# Patient Record
Sex: Female | Born: 1995 | Race: White | Hispanic: No | Marital: Single | Smoking: Never smoker
Health system: Southern US, Community
[De-identification: ages and names within clinical notes are randomized; demographics above are authoritative.]

## PROBLEM LIST (undated history)

## (undated) DIAGNOSIS — D499 Neoplasm of unspecified behavior of unspecified site: Secondary | ICD-10-CM

## (undated) DIAGNOSIS — G909 Disorder of the autonomic nervous system, unspecified: Secondary | ICD-10-CM

## (undated) DIAGNOSIS — D352 Benign neoplasm of pituitary gland: Secondary | ICD-10-CM

## (undated) DIAGNOSIS — T782XXA Anaphylactic shock, unspecified, initial encounter: Secondary | ICD-10-CM

## (undated) DIAGNOSIS — J302 Other seasonal allergic rhinitis: Secondary | ICD-10-CM

## (undated) DIAGNOSIS — L309 Dermatitis, unspecified: Secondary | ICD-10-CM

## (undated) DIAGNOSIS — M329 Systemic lupus erythematosus, unspecified: Secondary | ICD-10-CM

## (undated) HISTORY — DX: Neoplasm of unspecified behavior of unspecified site: D49.9

## (undated) HISTORY — PX: ADENOIDECTOMY: SUR15

## (undated) HISTORY — PX: TONSILLECTOMY: SUR1361

## (undated) HISTORY — DX: Systemic lupus erythematosus, unspecified: M32.9

## (undated) HISTORY — PX: OTHER SURGICAL HISTORY: SHX169

---
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
## 2016-03-09 ENCOUNTER — Encounter: Payer: Self-pay | Admitting: *Deleted

## 2016-03-09 ENCOUNTER — Emergency Department
Admission: EM | Admit: 2016-03-09 | Discharge: 2016-03-09 | Disposition: A | Discharge status: Home / Self Care | Attending: Emergency Medicine | Admitting: Emergency Medicine

## 2016-03-09 DIAGNOSIS — R0602 Shortness of breath: Secondary | ICD-10-CM | POA: Diagnosis present

## 2016-03-09 DIAGNOSIS — T783XXA Angioneurotic edema, initial encounter: Secondary | ICD-10-CM | POA: Diagnosis not present

## 2016-03-09 DIAGNOSIS — T7840XA Allergy, unspecified, initial encounter: Secondary | ICD-10-CM

## 2016-03-09 HISTORY — DX: Disorder of the autonomic nervous system, unspecified: G90.9

## 2016-03-09 HISTORY — DX: Other seasonal allergic rhinitis: J30.2

## 2016-03-09 HISTORY — DX: Anaphylactic shock, unspecified, initial encounter: T78.2XXA

## 2016-03-09 HISTORY — DX: Dermatitis, unspecified: L30.9

## 2016-03-09 HISTORY — DX: Benign neoplasm of pituitary gland: D35.2

## 2016-03-09 MED ORDER — EPINEPHRINE 0.3 MG/0.3ML IJ SOAJ
0.3000 mg | Freq: Once | INTRAMUSCULAR | Status: AC
  Administered 2016-03-09: 0.3 mg via INTRAMUSCULAR
  Filled 2016-03-09: qty 0.3

## 2016-03-09 MED ORDER — SODIUM CHLORIDE 0.9 % IV BOLUS (SEPSIS)
1000.0000 mL | Freq: Once | INTRAVENOUS | Status: AC
  Administered 2016-03-09: 1000 mL via INTRAVENOUS

## 2016-03-09 MED ORDER — PREDNISONE 20 MG PO TABS: 20.0000 mg | ORAL_TABLET | Freq: Every day | ORAL | 0 refills | Status: DC

## 2016-03-09 MED ORDER — DIPHENHYDRAMINE HCL 50 MG/ML IJ SOLN
25.0000 mg | Freq: Once | INTRAMUSCULAR | Status: AC
  Administered 2016-03-09: 25 mg via INTRAVENOUS

## 2016-03-09 MED ORDER — FAMOTIDINE IN NACL 20-0.9 MG/50ML-% IV SOLN
20.0000 mg | Freq: Once | INTRAVENOUS | Status: AC
  Administered 2016-03-09: 20 mg via INTRAVENOUS

## 2016-03-09 MED ORDER — METHYLPREDNISOLONE SODIUM SUCC 125 MG IJ SOLR
125.0000 mg | Freq: Once | INTRAMUSCULAR | Status: AC
  Administered 2016-03-09: 125 mg via INTRAVENOUS

## 2016-03-09 NOTE — ED Notes (Signed)

## 2016-03-09 NOTE — ED Provider Notes (Signed)
[facility] Emergency Department Provider Note   ____________________________________________   First MD Initiated Contact with Patient 03/09/16 0033     (approximate)  I have reviewed the triage vital signs and the nursing notes.   HISTORY  Chief Complaint Allergic Reaction    HPI Kelli Harris is a 20 y.o. female comes into the hospital today with some facial swelling and shortness of breath. The patient's father reports that she came home from work around 10:30. He reports that around 11:15 she started feeling as though she was having trouble breathing and her face was swollen. The left side seems to be more swollen than the right. 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 He reports that she did take some Benadryl at home but because she was having some hoarseness of her voice as well as some shortness of breath he decided to come in for evaluation. The patient works at [employer] but does not report any exposures. He reports the Benadryl typically helps him when she has anaphylaxis it comes on quickly. She had some spaghetti for dinner which she's had before and then played with the dog. The patient did not use her EpiPen tonight. Dad was concerned so he decided to bring the patient in for evaluation.   Past Medical History:  Diagnosis Date  . Anaphylaxis   . Autonomic dysfunction   . Eczema   . Pituitary microadenoma ([place])   . Seasonal allergies     There are no active problems to display for this patient.   Past Surgical History:  Procedure Laterality Date  . ADENOIDECTOMY    . Deviated septum repair    . TONSILLECTOMY      Prior to Admission medications   Medication Sig Start Date End Date Taking? Authorizing Provider  predniSONE (DELTASONE) 20 MG tablet Take 1 tablet (20 mg total) by mouth daily. 03/09/16 03/09/17  Loney Hering, MD    Allergies Review of patient's  allergies indicates no known allergies.  No family history on file.  Social History Social History  Substance Use Topics  . Smoking status: Never Smoker  . Smokeless tobacco: Never Used  . Alcohol use No    Review of Systems Constitutional: No fever/chills Eyes: No visual changes. ENT: Hoarse voice. Cardiovascular: Denies chest pain. Respiratory:  shortness of breath. Gastrointestinal: No abdominal pain.  No nausea, no vomiting.  No diarrhea.  No constipation. Genitourinary: Negative for dysuria. Musculoskeletal: Negative for back pain. Skin: Negative for rash. Neurological: Negative for headaches, focal weakness or numbness.  10-point ROS otherwise negative.  ____________________________________________   PHYSICAL EXAM:  VITAL SIGNS: ED Triage Vitals  Enc Vitals Group     BP 03/09/16 0026 112/79     Pulse Rate 03/09/16 0026 78     Resp 03/09/16 0026 18     Temp 03/09/16 0026 97.7 F (36.5 C)     Temp Source 03/09/16 0026 Oral     SpO2 03/09/16 0026 99 %     Weight 03/09/16 0027 117 lb (53.1 kg)     Height 03/09/16 0027 5\' 2"  (1.575 m)     Head Circumference --      Peak Flow --      Pain Score 03/09/16 0027 8     Pain Loc --      Pain Edu? --      Excl. in [place]? --     Constitutional: Alert and oriented.  Well appearing and in no acute distress. Eyes: Conjunctivae are normal. PERRL. EOMI. Head: Atraumatic. Nose: No congestion/rhinnorhea. Mouth/Throat: Mucous membranes are moist.  Oropharynx non-erythematous. Cardiovascular: Normal rate, regular rhythm. Grossly normal heart sounds.  Good peripheral circulation. Respiratory: Normal respiratory effort.  No retractions. Lungs CTAB. Gastrointestinal: Soft and nontender. No distention. Positive bowel sounds Musculoskeletal: No lower extremity tenderness nor edema.  Neurologic:  Normal speech and language.  Skin:  Skin is warm, dry and intact.  Psychiatric: Mood and affect are normal.    ____________________________________________   LABS (all labs ordered are listed, but only abnormal results are displayed)  Labs Reviewed - No data to display ____________________________________________  EKG  none ____________________________________________  RADIOLOGY  none ____________________________________________   PROCEDURES  Procedure(s) performed: None  Procedures  Critical Care performed: No  ____________________________________________   INITIAL IMPRESSION / ASSESSMENT AND PLAN / ED COURSE  Pertinent labs & imaging results that were available during my care of the patient were reviewed by me and considered in my medical decision making (see chart for details).  This is an 20 year old female who comes into the hospital today with some facial swelling and shortness of breath. 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 I will give the patient some Solu-Medrol, Pepcid as well as a dose of Benadryl. Given the patient's hoarse voice and shortness of breath I will also give her dose of epinephrine. I will reassess the patient after she's received the medications. She will also receive a liter of normal saline.  Clinical Course   After approximately 3 hours the patient's facial swelling did have some improvement. Her breathing is easier. Her voice is still quiet but is clear. I will discharge the patient to home. She reports that she has multiple EpiPen's at home and she does have Benadryl as well. I will give the patient some steroids and have her follow back up with her allergist. I discussed this with the patient's father who has no further questions or concerns and the patient be discharged home.  ____________________________________________   FINAL CLINICAL IMPRESSION(S) / ED DIAGNOSES  Final diagnoses:  Allergic reaction, initial encounter  Angioedema, initial encounter      NEW MEDICATIONS STARTED DURING THIS VISIT:  New  Prescriptions   PREDNISONE (DELTASONE) 20 MG TABLET    Take 1 tablet (20 mg total) by mouth daily.     Note:  This document was prepared using Dragon voice recognition software and may include unintentional dictation errors.    Loney Hering, MD 03/09/16 [phone]

## 2016-03-09 NOTE — ED Triage Notes (Addendum)
Pt has swelling to left side of face.  Pt took 1 benadryl at 2315.  Pt reports sore throat and sob.  Sx began at 2315.  No ras noted.  No itching.

## 2016-06-04 ENCOUNTER — Ambulatory Visit
Admission: EM | Admit: 2016-06-04 | Discharge: 2016-06-04 | Disposition: A | Discharge status: Home / Self Care | Attending: Family Medicine | Admitting: Family Medicine

## 2016-06-04 ENCOUNTER — Encounter: Payer: Self-pay | Admitting: Emergency Medicine

## 2016-06-04 DIAGNOSIS — K529 Noninfective gastroenteritis and colitis, unspecified: Secondary | ICD-10-CM | POA: Diagnosis not present

## 2016-06-04 MED ORDER — SODIUM CHLORIDE 0.9 % IV BOLUS (SEPSIS)
1000.0000 mL | Freq: Once | INTRAVENOUS | Status: AC
  Administered 2016-06-04: 1000 mL via INTRAVENOUS

## 2016-06-04 MED ORDER — PROMETHAZINE HCL 25 MG PO TABS: 25.0000 mg | ORAL_TABLET | Freq: Four times a day (QID) | ORAL | 0 refills | Status: DC | PRN

## 2016-06-04 MED ORDER — PROMETHAZINE HCL 25 MG/ML IJ SOLN
25.0000 mg | Freq: Once | INTRAMUSCULAR | Status: AC
  Administered 2016-06-04: 25 mg via INTRAMUSCULAR

## 2016-06-04 NOTE — ED Provider Notes (Signed)
CSN: [id number]     Arrival date & time 06/04/16  1808 History   First MD Initiated Contact with Patient 06/04/16 1913     Chief Complaint  Patient presents with  . Abdominal Pain   (Consider location/radiation/quality/duration/timing/severity/associated sxs/prior Treatment) 20 year old female presents with headache, malaise, abdominal pain and diarrhea that started yesterday evening. Then started dry-heaving late last night- last episode about 2 hours ago. Denies any fever or URI symptoms. Has been unable to keep down fluids for the past 12 hours. No other family members ill with GI symptoms. Mom has been sick with a URI/cough but no GI illness. Takes Zyrtec daily for allergies.    The history is provided by the patient and a parent.    Past Medical History:  Diagnosis Date  . Anaphylaxis   . Autonomic dysfunction   . Eczema   . Pituitary microadenoma ([place])   . Seasonal allergies    Past Surgical History:  Procedure Laterality Date  . ADENOIDECTOMY    . Deviated septum repair    . TONSILLECTOMY     History reviewed. No pertinent family history. Social History  Substance Use Topics  . Smoking status: Never Smoker  . Smokeless tobacco: Never Used  . Alcohol use No   OB History    No data available     Review of Systems  Constitutional: Positive for appetite change, chills and fatigue. Negative for fever.  HENT: Negative for congestion and sore throat.   Respiratory: Negative for cough, chest tightness and shortness of breath.   Cardiovascular: Negative for chest pain.  Gastrointestinal: Positive for abdominal pain, diarrhea, nausea and vomiting. Negative for blood in stool.  Genitourinary: Negative for difficulty urinating, dysuria, hematuria, pelvic pain and vaginal discharge.  Musculoskeletal: Positive for myalgias. Negative for back pain, neck pain and neck stiffness.  Skin: Negative for rash.  Neurological: Positive for dizziness, light-headedness and headaches.  Negative for syncope and numbness.  Hematological: Negative for adenopathy.    Allergies  Peanut-containing drug products  Home Medications   Prior to Admission medications   Medication Sig Start Date End Date Taking? Authorizing Provider  cetirizine (ZYRTEC) 10 MG tablet Take 10 mg by mouth daily.   Yes Historical Provider, MD  norgestimate-ethinyl estradiol (ORTHO-CYCLEN,SPRINTEC,PREVIFEM) 0.25-35 MG-MCG tablet Take 1 tablet by mouth daily.   Yes Historical Provider, MD  promethazine (PHENERGAN) 25 MG tablet Take 1 tablet (25 mg total) by mouth every 6 (six) hours as needed for nausea or vomiting. 06/04/16   Katy Apo, NP   Meds Ordered and Administered this Visit   Medications  sodium chloride 0.9 % bolus 1,000 mL (1,000 mLs Intravenous Given 06/04/16 1943)  promethazine (PHENERGAN) injection 25 mg (25 mg Intramuscular Given 06/04/16 1934)    BP (!) 99/48 (BP Location: Right Arm) Comment: Zoe Lan, NP notified  Pulse (!) 109   Temp 98.7 F (37.1 C) (Oral)   Resp 16   Ht 5\' 2"  (1.575 m)   Wt 117 lb (53.1 kg)   LMP 05/14/2016 (Approximate)   SpO2 98%   BMI 21.40 kg/m  No data found.   Physical Exam  Constitutional: She is oriented to person, place, and time. She appears well-developed and well-nourished. She appears lethargic. She is sleeping. She has a sickly appearance. No distress.  HENT:  Head: Normocephalic and atraumatic.  Right Ear: Hearing, tympanic membrane, external ear and ear canal normal.  Left Ear: Hearing, tympanic membrane, external ear and ear canal normal.  Nose:  Nose normal.  Mouth/Throat: Uvula is midline and oropharynx is clear and moist. Mucous membranes are dry.  Neck: Normal range of motion. Neck supple.  Cardiovascular: Regular rhythm, normal heart sounds and normal pulses.  Tachycardia present.   Pulmonary/Chest: Effort normal and breath sounds normal. No respiratory distress. She has no wheezes.  Abdominal: Soft. Bowel sounds are  normal. There is no hepatosplenomegaly. There is generalized tenderness. There is no rigidity, no rebound, no guarding and no CVA tenderness.  Lymphadenopathy:    She has no cervical adenopathy.  Neurological: She is oriented to person, place, and time. She appears lethargic.  Skin: Skin is warm and dry.  Psychiatric: She has a normal mood and affect.    Urgent Care Course   Clinical Course     Procedures (including critical care time)  Labs Review Labs Reviewed - No data to display  Imaging Review No results found.   Visual Acuity Review  Right Eye Distance:   Left Eye Distance:   Bilateral Distance:    Right Eye Near:   Left Eye Near:    Bilateral Near:         MDM   1. Gastroenteritis    Discussed with mom that she probably has a viral illness. Gave 1 L of fluids today along with Phenergan 25mg  IM. After about 1 hour, patient was feeling better and able to take some sips of water. BP still low but mom indicated that her daughter's diastolic is usually in the 50 to 60's. Recommend continue Phenergan 25mg  1 tablet every 6 hours as needed for nausea. Recommend clear liquids, Gatorade or Powerade tonight and advance diet as tolerated. Note written for work for next 2 days. Patient able to walk out with assistance from Mom. If unable to keep down fluids over the next 12 hours, recommend go to ER for further evaluation. Otherwise follow-up with her primary care provider in 4 to 5 days if diarrhea persists.    Katy Apo, NP 06/04/16 2110

## 2016-06-04 NOTE — ED Triage Notes (Signed)
Patient c/o vomiting, abdominal pain, and diarrhea that started last night.

## 2016-06-04 NOTE — Discharge Instructions (Signed)
Recommend continue Phenergan 25mg  1 tablet every 6 hours as needed for nausea. Slowly increase fluid intake- drink Gatorade or Powerade tonight and then eat a bland diet. Rest. Follow-up with your primary care provider in 3 to 4 days if not resolving or go to ER if unable to keep down fluids over the next 12 hours.

## 2016-06-24 ENCOUNTER — Emergency Department
Admission: EM | Admit: 2016-06-24 | Discharge: 2016-06-25 | Disposition: A | Discharge status: Home / Self Care | Attending: Emergency Medicine | Admitting: Emergency Medicine

## 2016-06-24 ENCOUNTER — Encounter: Payer: Self-pay | Admitting: Emergency Medicine

## 2016-06-24 DIAGNOSIS — T782XXA Anaphylactic shock, unspecified, initial encounter: Secondary | ICD-10-CM | POA: Diagnosis not present

## 2016-06-24 DIAGNOSIS — T7840XA Allergy, unspecified, initial encounter: Secondary | ICD-10-CM | POA: Diagnosis present

## 2016-06-24 DIAGNOSIS — Z91010 Allergy to peanuts: Secondary | ICD-10-CM | POA: Insufficient documentation

## 2016-06-24 MED ORDER — FAMOTIDINE IN NACL 20-0.9 MG/50ML-% IV SOLN
20.0000 mg | Freq: Once | INTRAVENOUS | Status: AC
  Administered 2016-06-24: 20 mg via INTRAVENOUS
  Filled 2016-06-24: qty 50

## 2016-06-24 MED ORDER — EPINEPHRINE 0.3 MG/0.3ML IJ SOAJ: INTRAMUSCULAR | 3 refills | Status: AC

## 2016-06-24 MED ORDER — DIPHENHYDRAMINE HCL 50 MG/ML IJ SOLN
25.0000 mg | Freq: Once | INTRAMUSCULAR | Status: AC
  Administered 2016-06-24: 25 mg via INTRAVENOUS
  Filled 2016-06-24: qty 1

## 2016-06-24 MED ORDER — PREDNISONE 10 MG PO TABS: ORAL_TABLET | ORAL | 0 refills | Status: DC

## 2016-06-24 MED ORDER — METHYLPREDNISOLONE SODIUM SUCC 125 MG IJ SOLR
125.0000 mg | Freq: Once | INTRAMUSCULAR | Status: AC
  Administered 2016-06-24: 125 mg via INTRAVENOUS
  Filled 2016-06-24: qty 2

## 2016-06-24 MED ORDER — EPINEPHRINE 0.3 MG/0.3ML IJ SOAJ: 0.3000 mg | Freq: Once | INTRAMUSCULAR | 2 refills | Status: AC

## 2016-06-24 MED ORDER — SODIUM CHLORIDE 0.9 % IV BOLUS (SEPSIS)
1000.0000 mL | Freq: Once | INTRAVENOUS | Status: AC
  Administered 2016-06-24: 1000 mL via INTRAVENOUS

## 2016-06-24 NOTE — ED Provider Notes (Addendum)
[facility] Emergency Department Provider Note ____________________________________________   I have reviewed the triage vital signs and the triage nursing note.  HISTORY  Chief Complaint Allergic Reaction   Historian Patient's history limited due to severe allergic reaction Mom provides the history  HPI Kelli Harris is a 21 y.o. female with a history of anaphylaxis to nuts, presents with trouble breathing without known ingestion. Mom states she got a phone call around 8:20 PM from the patient who was at work stating that she was having some trouble breathing. Mom went to pick her up and the patient took by mouth Benadryl. At home she was continuing to have trouble breathing and so they brought her over to the emergency department. She had an EpiPen but did not take it.  No known exposure to allergen.  Symptoms are moderate to severe.    Past Medical History:  Diagnosis Date  . Anaphylaxis   . Autonomic dysfunction   . Eczema   . Pituitary microadenoma ([place])   . Seasonal allergies     There are no active problems to display for this patient.   Past Surgical History:  Procedure Laterality Date  . ADENOIDECTOMY    . Deviated septum repair    . TONSILLECTOMY      Prior to Admission medications   Medication Sig Start Date End Date Taking? Authorizing Provider  cetirizine (ZYRTEC) 10 MG tablet Take 10 mg by mouth daily.    Historical Provider, MD  EPINEPHrine (AUVI-Q) 0.3 mg/0.3 mL IJ SOAJ injection Inject into the upper outer thigh as needed for anaphylaxis 06/24/16   Lisa Roca, MD  EPINEPHrine (EPIPEN 2-PAK) 0.3 mg/0.3 mL IJ SOAJ injection Inject 0.3 mLs (0.3 mg total) into the muscle once. 06/24/16 06/24/16  Lisa Roca, MD  norgestimate-ethinyl estradiol (ORTHO-CYCLEN,SPRINTEC,PREVIFEM) 0.25-35 MG-MCG tablet Take 1 tablet by mouth daily.    Historical Provider, MD  predniSONE (DELTASONE) 10 MG tablet 40 mg daily for 4 days 06/24/16    Lisa Roca, MD  promethazine (PHENERGAN) 25 MG tablet Take 1 tablet (25 mg total) by mouth every 6 (six) hours as needed for nausea or vomiting. 06/04/16   Katy Apo, NP    Allergies  Allergen Reactions  . Peanut-Containing Drug Products Anaphylaxis    No family history on file.  Social History Social History  Substance Use Topics  . Smoking status: Never Smoker  . Smokeless tobacco: Never Used  . Alcohol use No    Review of Systems  Constitutional: Negative for Recent illness. Eyes: Negative for visual changes. ENT: Positive for throat swelling. Cardiovascular: Negative for chest pain. Respiratory: Positive for trouble breathing and shortness of breath.. Gastrointestinal: Negative for abdominal pain, vomiting and diarrhea. Genitourinary: Negative for dysuria. Musculoskeletal: Negative for back pain. Skin: Flushed without obvious hives Neurological: Negative for headache. 10 point Review of Systems otherwise negative ____________________________________________   PHYSICAL EXAM:  VITAL SIGNS: ED Triage Vitals  Enc Vitals Group     BP 06/24/16 2107 114/81     Pulse Rate 06/24/16 2107 (!) 126     Resp 06/24/16 2107 (!) 30     Temp --      Temp Source 06/24/16 2107 Oral     SpO2 06/24/16 2107 97 %     Weight 06/24/16 2108 120 lb (54.4 kg)     Height 06/24/16 2108 5\' 2"  (1.575 m)     Head Circumference --      Peak Flow --  Pain Score --      Pain Loc --      Pain Edu? --      Excl. in [place]? --      Constitutional: Alert and Tachypnea and anxious, or respiratory distress. HEENT   Head: Normocephalic and atraumatic.      Eyes: Conjunctivae are normal. PERRL. Normal extraocular movements.      Ears:         Nose: No congestion/rhinnorhea.   Mouth/Throat: Mucous membranes are moist.   Neck: No stridor. Cardiovascular/Chest: Tachycardic, regular rhythm.  No murmurs, rubs, or gallops. Respiratory: Tachypnea without retractions. Moving fair  air, and no wheezing. Gastrointestinal: Soft. No distention, no guarding, no rebound. Nontender.   Genitourinary/rectal:Deferred Musculoskeletal: Nontender with normal range of motion in all extremities. No joint effusions.  No lower extremity tenderness.  No edema. Neurologic:  Normal speech and language. No gross or focal neurologic deficits are appreciated. Skin:  Skin is warm, dry and intact. No rash noted. Psychiatric: Mood and affect are normal. Speech and behavior are normal. Patient exhibits appropriate insight and judgment.   ____________________________________________  LABS (pertinent positives/negatives)  Labs Reviewed - No data to display  ____________________________________________    EKG I, Lisa Roca, MD, the attending physician have personally viewed and interpreted all ECGs.  None ____________________________________________  RADIOLOGY All Xrays were viewed by me. Imaging interpreted by Radiologist.  None __________________________________________  PROCEDURES  Procedure(s) performed: None  Critical Care performed: None  ____________________________________________   ED COURSE / ASSESSMENT AND PLAN  Pertinent labs & imaging results that were available during my care of the patient were reviewed by me and considered in my medical decision making (see chart for details).   Although no ingestion noted, she does have a severe peanut allergy. Nurse told me patient's symptoms and I asked for EpiPen to be given. Nurse gave patient's own EpiPen just before I entered the room and patient's heart rate is 120 to 1:30 and she is anxious but states that her throat and breathing feel better already. She's having some hand cramping.  Blood pressure systolic [id number].  I ordered IV dose of Pepcid, normal saline, Solu-Medrol, and Benadryl.  I discussed with mom and patient and grandfather the importance of early epinephrine for allergic reaction/anaphylaxis in the  future. We discussed there is no exact science behind the amount of time to observe the patient here in the emergency department, and decided to watch her for about 4 hours.  We discussed the potential for biphasic reaction, unknown incidence or risk factors for that, but they have EpiPen and instructed to use it if she has any return of anaphylaxis and we again went over two body systems or any airway issues.   Patient care transferred to Dr. Owens Shark at shift change 11:30 PM. I would anticipate a reevaluation at 1 AM and is reassuring, may be discharged with my prepared discharge instructions and prescriptions. Being prescribed autoinjector refill for EpiPen as well as Auvi-q and short course of prednisone. She is also instructed to take Zantac over-the-counter once daily for 4 days, and Benadryl as needed for any skin redness or itching.    CONSULTATIONS:  None   Patient / Family / Caregiver informed of clinical course, medical decision-making process, and agree with plan.   I discussed return precautions, follow-up instructions, and discharge instructions with patient and/or family.  Addended to include, did not suspect acute cardiac, infectious, or respiratory emergency, symptoms consistent with anaphylactic reaction. ___________________________________________   FINAL CLINICAL  IMPRESSION(S) / ED DIAGNOSES   Final diagnoses:  Anaphylaxis, initial encounter              Note: This dictation was prepared with Dragon dictation. Any transcriptional errors that result from this process are unintentional    Lisa Roca, MD 06/24/16 [place], MD 06/24/16 2329

## 2016-06-24 NOTE — ED Triage Notes (Signed)
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

## 2016-06-24 NOTE — ED Notes (Signed)
Pt admin auto-injector epi pen by nurse

## 2016-06-24 NOTE — ED Notes (Signed)
Administered patient's own epipen.  Patient in respiratory distress with minimal air movement.  MD notified.

## 2016-06-24 NOTE — Discharge Instructions (Signed)
You were treated tonight for anaphylaxis to unknown exposure. As we discussed, please treat anaphylaxis early.  Go ahead and take your epinephrine autoinjector for any trouble breathing or throat discomfort, for any passing out, or for hives plus vomiting/abdominal pain/diarrhea or for any repeated vomiting.

## 2016-06-25 NOTE — ED Notes (Signed)
Pt discharged to home.  Family member driving.  Discharge instructions reviewed.  Verbalized understanding.  No questions or concerns at this time.  Teach back verified.  Pt in NAD.  No items left in ED.   

## 2016-09-03 ENCOUNTER — Ambulatory Visit
Admission: EM | Admit: 2016-09-03 | Discharge: 2016-09-03 | Disposition: A | Discharge status: Home / Self Care | Attending: Family Medicine | Admitting: Family Medicine

## 2016-09-03 ENCOUNTER — Encounter: Payer: Self-pay | Admitting: Certified Nurse Midwife

## 2016-09-03 ENCOUNTER — Ambulatory Visit (INDEPENDENT_AMBULATORY_CARE_PROVIDER_SITE_OTHER): Admitting: Certified Nurse Midwife

## 2016-09-03 ENCOUNTER — Encounter: Payer: Self-pay | Admitting: *Deleted

## 2016-09-03 VITALS — BP 102/66 | HR 88 | Ht 62.0 in | Wt 121.0 lb

## 2016-09-03 DIAGNOSIS — N926 Irregular menstruation, unspecified: Secondary | ICD-10-CM | POA: Diagnosis not present

## 2016-09-03 DIAGNOSIS — Z01419 Encounter for gynecological examination (general) (routine) without abnormal findings: Secondary | ICD-10-CM | POA: Diagnosis not present

## 2016-09-03 DIAGNOSIS — S161XXA Strain of muscle, fascia and tendon at neck level, initial encounter: Secondary | ICD-10-CM | POA: Diagnosis not present

## 2016-09-03 MED ORDER — KETOROLAC TROMETHAMINE 60 MG/2ML IM SOLN
30.0000 mg | Freq: Once | INTRAMUSCULAR | Status: AC
  Administered 2016-09-03: 30 mg via INTRAMUSCULAR

## 2016-09-03 MED ORDER — METAXALONE 800 MG PO TABS: 800.0000 mg | ORAL_TABLET | Freq: Three times a day (TID) | ORAL | 0 refills | Status: DC

## 2016-09-03 MED ORDER — NAPROXEN 500 MG PO TABS: 500.0000 mg | ORAL_TABLET | Freq: Two times a day (BID) | ORAL | 0 refills | Status: DC

## 2016-09-03 NOTE — ED Provider Notes (Signed)
CSN: [id number]     Arrival date & time 09/03/16  1658 History   First MD Initiated Contact with Patient 09/03/16 1720     Chief Complaint  Patient presents with  . Back Pain   (Consider location/radiation/quality/duration/timing/severity/associated sxs/prior Treatment) HPI  21 year old female who is accompanied by her mother is been having right side thoracic back pain since yesterday. She states most of the pain is seems to be over the scapula & trapezial muscle. Denies any radicular symptoms into her upper extremities. Has  pain with deep inspiration mostly yesterday but today is not as bad. Holds her neck. Stiff and is reluctant to move it. He has had a torticollis in the past she found relief with chiropractic.       Past Medical History:  Diagnosis Date  . Anaphylaxis   . Autonomic dysfunction   . Eczema   . Pituitary microadenoma ([place])   . Seasonal allergies    Past Surgical History:  Procedure Laterality Date  . ADENOIDECTOMY    . Deviated septum repair    . TONSILLECTOMY     History reviewed. No pertinent family history. Social History  Substance Use Topics  . Smoking status: Never Smoker  . Smokeless tobacco: Never Used  . Alcohol use No   OB History    Gravida Para Term Preterm AB Living   0 0 0 0 0 0   SAB TAB Ectopic Multiple Live Births   0 0 0 0 0     Review of Systems  Constitutional: Positive for activity change. Negative for chills, fatigue and fever.  Musculoskeletal: Positive for back pain, neck pain and neck stiffness.  All other systems reviewed and are negative.   Allergies  Other; Peanut-containing drug products; and Peanut oil  Home Medications   Prior to Admission medications   Medication Sig Start Date End Date Taking? Authorizing Provider  cetirizine (ZYRTEC) 10 MG tablet Take 10 mg by mouth daily.   Yes Historical Provider, MD  norgestimate-ethinyl estradiol (ORTHO-CYCLEN,SPRINTEC,PREVIFEM) 0.25-35 MG-MCG tablet Take 1 tablet by  mouth daily.   Yes Historical Provider, MD  EPINEPHrine (AUVI-Q) 0.3 mg/0.3 mL IJ SOAJ injection Inject into the upper outer thigh as needed for anaphylaxis 06/24/16   Lisa Roca, MD  metaxalone (SKELAXIN) 800 MG tablet Take 1 tablet (800 mg total) by mouth 3 (three) times daily. 09/03/16   Lorin Picket, PA-C  naproxen (NAPROSYN) 500 MG tablet Take 1 tablet (500 mg total) by mouth 2 (two) times daily with a meal. 09/03/16   Lorin Picket, PA-C   Meds Ordered and Administered this Visit   Medications  ketorolac (TORADOL) injection 30 mg (30 mg Intramuscular Given 09/03/16 1754)    BP 104/66 (BP Location: Left Arm)   Pulse (!) 116   Temp 97.6 F (36.4 C) (Oral)   Resp 16   LMP 09/02/2016 (Exact Date)   SpO2 100%  No data found.   Physical Exam  Constitutional: She is oriented to person, place, and time. She appears well-developed and well-nourished. No distress.  HENT:  Head: Normocephalic and atraumatic.  Eyes: Pupils are equal, round, and reactive to light. Right eye exhibits no discharge. Left eye exhibits no discharge.  Neck:  Examination of the cervical spine shows decreased range of motion to rotation bilaterally particularly to the right. Maximum pain is elicited with lateral flexion and extension on the right. Upper extremity sensation is intact to light touch. Upper extremity strength is intact. DTRs are 2+ over 4  and symmetrical in the upper extremities. There is palpable muscle spasm in the trapezial muscle .  Musculoskeletal:  Refer to neck exam  Neurological: She is alert and oriented to person, place, and time. She displays normal reflexes. No sensory deficit. She exhibits normal muscle tone. Coordination normal.  Skin: Skin is warm and dry. She is not diaphoretic.  Psychiatric: She has a normal mood and affect. Her behavior is normal. Judgment and thought content normal.  Nursing note and vitals reviewed.   Urgent Care Course     Procedures (including critical  care time)  Labs Review Labs Reviewed - No data to display  Imaging Review No results found.   Visual Acuity Review  Right Eye Distance:   Left Eye Distance:   Bilateral Distance:    Right Eye Near:   Left Eye Near:    Bilateral Near:     Medications  ketorolac (TORADOL) injection 30 mg (30 mg Intramuscular Given 09/03/16 1754)      MDM   1. Cervical myofascial strain, initial encounter    Discharge Medication List as of 09/03/2016  5:56 PM    START taking these medications   Details  metaxalone (SKELAXIN) 800 MG tablet Take 1 tablet (800 mg total) by mouth 3 (three) times daily., Starting Fri 09/03/2016, Normal    naproxen (NAPROSYN) 500 MG tablet Take 1 tablet (500 mg total) by mouth 2 (two) times daily with a meal., Starting Fri 09/03/2016, Normal      Plan: 1. Test/x-ray results and diagnosis reviewed with patient 2. rx as per orders; risks, benefits, potential side effects reviewed with patient 3. Recommend supportive treatment with Rest and symptom avoidance. Recommend ice 20 minutes out of every 2 hours 4 times daily. Is cautioned regarding use of Skelaxin with activities requiring concentration are judgment and not to drive while taking it. Follow up with her primary care physician or chiropractor. 4. F/u prn if symptoms worsen or don't improve     Lorin Picket, PA-C 09/03/16 1815

## 2016-09-03 NOTE — ED Triage Notes (Addendum)
Patient started having right side thoracic back pain yesterday. Patient unsure of mechanism of injury. No previous history of back injury.

## 2016-09-09 ENCOUNTER — Encounter: Payer: Self-pay | Admitting: Certified Nurse Midwife

## 2016-09-09 MED ORDER — NORGESTIMATE-ETH ESTRADIOL 0.25-35 MG-MCG PO TABS: 1.0000 | ORAL_TABLET | Freq: Every day | ORAL | 3 refills | Status: DC

## 2016-09-09 NOTE — Progress Notes (Signed)
Gynecology Annual Exam  PCP: [facility]  Chief Complaint:  Chief Complaint  Patient presents with  . Gynecologic Exam    History of Present Illness: Patient is a 21 y.o. G0P0000 presents for annual exam. The patient has no gyn complaints today, but she complains of onset back pain and right shoulder pain yesterday. She thinks she may have pulled a muscle.  Mother will take her to Urgent Care after this appointment. She takes Orthocyclen for cycle control and is not sexually active. Currently a student at [employer], she will be going to [employer] in the fall to major in genetics.  Her menses are q month, last 6 days with medium flow. Has cramping 2-3 days before menses starts. One dose of Naprasyn usually resolves the cramping. Her LMP was 09/02/2016. She has completed her Gardasil series.  Past Medical history is significant for an autonomic Nervous system disorder, a micropituitary tumor, and a severe peanut allergy. Since her last annual visit on 08/07/2015, she has had her wisdom teeth removed. She does not smoke or drink alcohol. She does not take illicit drugs. She does not do self breast exams. She exercises occasionally with HIIT, free weights and elliptical trainer. She does get adequate calcium in her diet.  Review of Systems: Review of Systems  Constitutional: Negative for chills, fever and weight loss.  HENT: Negative for congestion, sinus pain and sore throat.   Eyes: Negative for blurred vision and pain.  Respiratory: Negative for hemoptysis, shortness of breath and wheezing.   Cardiovascular: Negative for chest pain, palpitations and leg swelling.  Gastrointestinal: Negative for abdominal pain, blood in stool, diarrhea, heartburn, nausea and vomiting.  Genitourinary: Negative for dysuria, frequency, hematuria and urgency.  Musculoskeletal: Positive for back pain and myalgias. Negative for joint pain.       And right shoulder pain  Skin: Negative for itching  and rash.  Neurological: Negative for dizziness, tingling and headaches.  Endo/Heme/Allergies: Negative for environmental allergies and polydipsia. Does not bruise/bleed easily.       Negative for hirsutism   Psychiatric/Behavioral: Negative for depression. The patient is not nervous/anxious and does not have insomnia.     Past Medical History:  Past Medical History:  Diagnosis Date  . Anaphylaxis   . Autonomic dysfunction   . Eczema   . Pituitary microadenoma ([place])   . Seasonal allergies     Past Surgical History:  Past Surgical History:  Procedure Laterality Date  . ADENOIDECTOMY    . Deviated septum repair    . TONSILLECTOMY    . WISDOM TOOTH EXTRACTION  2017      Family History:  Family History  Problem Relation Age of Onset  . Osteoarthritis Father   . Diabetes Maternal Grandmother   . Rickets Paternal Grandmother   . Stroke Paternal Grandfather   . Breast cancer Neg Hx   . Ovarian cancer Neg Hx     Social History:  Social History   Social History  . Marital status: Single    Spouse name: N/A  . Number of children: N/A  . Years of education: N/A   Occupational History  . student    Social History Main Topics  . Smoking status: Never Smoker  . Smokeless tobacco: Never Used  . Alcohol use No  . Drug use: No  . Sexual activity: No   Other Topics Concern  . Not on file   Social History Narrative  . No narrative on file  Allergies:  Allergies  Allergen Reactions  . Other Anaphylaxis    ALL nuts -except for almond, pistachios, and walnuts  . Peanut-Containing Drug Products Anaphylaxis  . Peanut Oil Rash    Other reaction(s): ANAPHYLAXIS    Medications: Prior to Admission medications   Medication Sig Start Date End Date Taking? Authorizing Provider  cetirizine (ZYRTEC) 10 MG tablet Take 10 mg by mouth daily.   Yes Historical Provider, MD  EPINEPHrine (AUVI-Q) 0.3 mg/0.3 mL IJ SOAJ injection Inject into the upper outer thigh as needed  for anaphylaxis 06/24/16  Yes Lisa Roca, MD  norgestimate-ethinyl estradiol (ORTHO-CYCLEN,SPRINTEC,PREVIFEM) 0.25-35 MG-MCG tablet Take 1 tablet by mouth daily.   Yes Historical Provider, MD         naproxen (NAPROSYN) 500 MG tablet Take 1 tablet (500 mg total) by mouth 2 (two) times daily with a meal. 09/03/16   Lorin Picket, PA-C  [place] 27 mgm daily, lysine 500 mg daily, multivitamins, vitamin D3 400 IU  Physical Exam Vitals: Blood pressure 102/66, pulse 88, height 5\' 2"  (1.575 m), weight 54.9 kg (121 lb), last menstrual period 09/02/2016.  General: petite, WF, with discomfort moving right arm. Needs assistance removing hoodie. HEENT: normocephalic, anicteric Thyroid: no enlargement, no palpable nodules Pulmonary/ Chest Wall: No increased work of breathing, CTAB. Tenderness medial to the right scapula. Cardiovascular: RRR without murmur Breast: Breast symmetrical, no tenderness, no palpable nodules or masses, no skin or nipple retraction present, no nipple discharge.  No axillary, infraclavicular or  supraclavicular lymphadenopathy. Abdomen:  soft, non-tender, non-distended.  Umbilicus without lesions.  No hepatomegaly,  or masses palpable. No evidence of hernia  Musculoskeletal: decreased ROM of neck turning to right/ holds neck straight Extremities: no edema, erythema, or tenderness Neurologic: Grossly intact Psychiatric: mood appropriate, affect full    Assessment: 21 y.o. G0P0000 with history of irregular menses  Menses regular with birth control pills.  Possible muscle strain of back  Plan:  Will [facility]ill [facility] For a year. To urgent care for treatment of back pain. RTO in 1 year and prn. Reviewed self breast exam.  Kelli Harris, CNM

## 2016-10-30 ENCOUNTER — Other Ambulatory Visit: Payer: Self-pay | Admitting: Advanced Practice Midwife

## 2016-11-01 ENCOUNTER — Telehealth: Payer: Self-pay

## 2016-11-01 MED ORDER — NORGESTIMATE-ETH ESTRADIOL 0.25-35 MG-MCG PO TABS: 1.0000 | ORAL_TABLET | Freq: Every day | ORAL | 3 refills | Status: DC

## 2016-11-01 NOTE — Telephone Encounter (Signed)
Pt seen for annual on 09/03/16. Pt aware [facility].

## 2016-11-01 NOTE — Telephone Encounter (Signed)
CG patient.

## 2016-11-01 NOTE — Telephone Encounter (Signed)
Pt called.  She needs bc refill (which I see has already been done, but) pt is out and need one pack of bc sent to local [facility] in [place].  She is frustrated that she has to go thru this every time she needs a refill.  (pt needs annual sched.)  [phone]

## 2016-11-05 ENCOUNTER — Telehealth: Payer: Self-pay

## 2016-11-05 NOTE — Telephone Encounter (Signed)
Pt calling stating that mononessa was sent in not previfem.  Adv previfem was refilled and they are the same doses.  If wants previfem to let pharm know.

## 2016-11-25 ENCOUNTER — Telehealth: Payer: Self-pay

## 2016-11-25 NOTE — Telephone Encounter (Signed)
Mom, Sadie, calling about previfem rx to [facility].  Apparently there is a mix up/miscommunication between [place] and them - sooner rather than later.  [phone].

## 2016-11-26 NOTE — Telephone Encounter (Signed)
Pt's mother Sadie aware rx verbally [facility] refills.

## 2017-11-23 ENCOUNTER — Ambulatory Visit: Admitting: Obstetrics and Gynecology

## 2017-12-14 ENCOUNTER — Ambulatory Visit: Admitting: Obstetrics and Gynecology

## 2017-12-16 ENCOUNTER — Other Ambulatory Visit (HOSPITAL_COMMUNITY)
Admission: RE | Admit: 2017-12-16 | Discharge: 2017-12-16 | Disposition: A | Discharge status: Home / Self Care | Source: Ambulatory Visit | Attending: Obstetrics and Gynecology | Admitting: Obstetrics and Gynecology

## 2017-12-16 ENCOUNTER — Ambulatory Visit (INDEPENDENT_AMBULATORY_CARE_PROVIDER_SITE_OTHER): Admitting: Advanced Practice Midwife

## 2017-12-16 ENCOUNTER — Encounter: Payer: Self-pay | Admitting: Advanced Practice Midwife

## 2017-12-16 VITALS — BP 100/60 | HR 68 | Ht 63.0 in | Wt 118.0 lb

## 2017-12-16 DIAGNOSIS — N760 Acute vaginitis: Secondary | ICD-10-CM

## 2017-12-16 DIAGNOSIS — F329 Major depressive disorder, single episode, unspecified: Secondary | ICD-10-CM | POA: Insufficient documentation

## 2017-12-16 DIAGNOSIS — N898 Other specified noninflammatory disorders of vagina: Secondary | ICD-10-CM | POA: Diagnosis not present

## 2017-12-16 DIAGNOSIS — F419 Anxiety disorder, unspecified: Secondary | ICD-10-CM | POA: Insufficient documentation

## 2017-12-16 DIAGNOSIS — B9689 Other specified bacterial agents as the cause of diseases classified elsewhere: Secondary | ICD-10-CM

## 2017-12-16 DIAGNOSIS — F32A Depression, unspecified: Secondary | ICD-10-CM | POA: Insufficient documentation

## 2017-12-16 DIAGNOSIS — E237 Disorder of pituitary gland, unspecified: Secondary | ICD-10-CM | POA: Insufficient documentation

## 2017-12-16 MED ORDER — METRONIDAZOLE 500 MG PO TABS: 500.0000 mg | ORAL_TABLET | Freq: Two times a day (BID) | ORAL | 0 refills | Status: DC

## 2017-12-16 NOTE — Progress Notes (Signed)
Patient ID: Kelli Harris, female   DOB: January 10, 1996, 22 y.o.   MRN: [id number]  Reason for Consult: vaginal irritation (Discharge w/odor)    Subjective:     HPI:  Kelli Harris is a 22 y.o. female is in the office today for ongoing concern of vaginal discharge and odor. She has also had some vaginal irritation but does not currently have that. Her symptoms began about a month ago. About 3 weeks ago she was seen at [facility] and was diagnosed and treated for BV. She took the 7 day oral course of Metronidazole. Her symptoms were improved during that time, but since then she states the discharge has persisted along with mild odor.   She is sexually active but denies ever having vaginal penetration intercourse due to perceived pain. She is unable to wear tampons due to pain associated with wearing them. She does not have a concern for STDs.   She used a new body care product about the same time as when her symptoms began.   Her main concern today is the persistent discharge. Discussion of comfort/preventive measures. Discussion of pelvic floor PT if she would like to pursue that at some time.   Past Medical History:  Diagnosis Date  . Anaphylaxis   . Autonomic dysfunction   . Eczema   . Pituitary microadenoma ([place])   . Seasonal allergies    Family History  Problem Relation Age of Onset  . Osteoarthritis Father   . Diabetes Maternal Grandmother   . Rickets Paternal Grandmother   . Stroke Paternal Grandfather   . Breast cancer Neg Hx   . Ovarian cancer Neg Hx    Past Surgical History:  Procedure Laterality Date  . ADENOIDECTOMY    . Deviated septum repair    . TONSILLECTOMY    . WISDOM TOOTH EXTRACTION  2017    Short Social History:  Social History   Tobacco Use  . Smoking status: Never Smoker  . Smokeless tobacco: Never Used  Substance Use Topics  . Alcohol use: No    Allergies  Allergen Reactions  . Other Anaphylaxis    ALL nuts -except for almond,  pistachios, and walnuts  . Peanut-Containing Drug Products Anaphylaxis  . Peanut Oil Rash    Other reaction(s): ANAPHYLAXIS    Current Outpatient Medications  Medication Sig Dispense Refill  . cetirizine (ZYRTEC) 10 MG tablet Take 10 mg by mouth daily.    . naproxen (NAPROSYN) 500 MG tablet Take 1 tablet (500 mg total) by mouth 2 (two) times daily with a meal. 60 tablet 0  . norgestimate-ethinyl estradiol (PREVIFEM) 0.25-35 MG-MCG tablet Take 1 tablet by mouth daily. 84 tablet 3  . Cholecalciferol (VITAMIN D3) 400 units CAPS Take 1 capsule by mouth daily.    Marland Kitchen EPINEPHrine (AUVI-Q) 0.3 mg/0.3 mL IJ SOAJ injection Inject into the upper outer thigh as needed for anaphylaxis (Patient not taking: Reported on 12/16/2017) 1 Device 3  . fluconazole (DIFLUCAN) 150 MG tablet TAKE ONE TABLET BY MOUTH AS A ONE TIME DOSE  0  . Lysine HCl 500 MG TABS Take 1 tablet by mouth daily.    . methylphenidate 27 MG PO CR tablet Take 27 mg by mouth every morning.    . metroNIDAZOLE (FLAGYL) 500 MG tablet Take 1 tablet (500 mg total) by mouth 2 (two) times daily for 7 days. 14 tablet 0  . Multiple Vitamin (MULTIVITAMIN) capsule Take 1 capsule by mouth daily.    . Omega-3 Fatty Acids (  FISH OIL) 1000 MG CAPS Take by mouth.    . vitamin C (ASCORBIC ACID) 500 MG tablet Take by mouth.     No current facility-administered medications for this visit.     Review of Systems  Constitutional:  Constitutional negative. HENT: HENT negative.  Eyes: Eyes negative.  Respiratory: Respiratory negative.  Cardiovascular: Cardiovascular negative.  GI: Gastrointestinal negative.  GU:       Vaginal discharge with odor Musculoskeletal: Musculoskeletal negative.  Skin: Skin negative.  Neurological: Neurological negative. Hematologic: Hematologic/lymphatic negative.  Psychiatric: Psychiatric negative.        Objective:  Objective   Vitals:   12/16/17 1034  BP: 100/60  Pulse: 68  Weight: 118 lb (53.5 kg)  Height: 5\' 3"   (1.6 m)   Body mass index is 20.9 kg/m.  Vital Signs: BP 100/60 (BP Location: Left Arm, Patient Position: Sitting, Cuff Size: Normal)   Pulse 68   Ht 5\' 3"  (1.6 m)   Wt 118 lb (53.5 kg)   LMP 11/23/2017   BMI 20.90 kg/m  Constitutional: Well nourished, well developed female in no acute distress.  HEENT: normal Skin: Warm and dry.  Respiratory:  Normal respiratory effort Psych: Alert and Oriented x3. No memory deficits. Normal mood and affect.   Pelvic exam:  is not limited by body habitus EGBUS: within normal limits, no external evidence of discharge  Vagina: swab only for specimen collection, per patient preference no speculum Cervix: not evaluated  Wet Prep: positive for a few clue cells, negative for whiff or yeast       Assessment/Plan:     22 yo G0P0 female with probable bacterial vaginosis  NuSwab vaginitis to lab Metronidazole 500 mg PO BID 7 days    Rod Can CNM

## 2017-12-16 NOTE — Patient Instructions (Signed)

## 2017-12-19 LAB — CERVICOVAGINAL ANCILLARY ONLY
BACTERIAL VAGINITIS: NEGATIVE
CANDIDA VAGINITIS: POSITIVE — AB
TRICH (WINDOWPATH): NEGATIVE

## 2017-12-22 ENCOUNTER — Other Ambulatory Visit: Payer: Self-pay | Admitting: Advanced Practice Midwife

## 2017-12-22 DIAGNOSIS — B373 Candidiasis of vulva and vagina: Secondary | ICD-10-CM

## 2017-12-22 DIAGNOSIS — B3731 Acute candidiasis of vulva and vagina: Secondary | ICD-10-CM

## 2017-12-22 MED ORDER — FLUCONAZOLE 150 MG PO TABS: 150.0000 mg | ORAL_TABLET | Freq: Once | ORAL | 1 refills | Status: AC

## 2017-12-22 NOTE — Progress Notes (Signed)
Spoke with patient and advised discontinue flagyl since lab came back negative for BV and positive for yeast. Rx diflucan sent to patient pharmacy.

## 2023-02-11 ENCOUNTER — Ambulatory Visit
Admission: EM | Admit: 2023-02-11 | Discharge: 2023-02-11 | Disposition: A | Discharge status: Home / Self Care | Attending: Internal Medicine | Admitting: Internal Medicine

## 2023-02-11 DIAGNOSIS — I889 Nonspecific lymphadenitis, unspecified: Secondary | ICD-10-CM | POA: Diagnosis not present

## 2023-02-11 MED ORDER — FLUCONAZOLE 150 MG PO TABS: 150.0000 mg | ORAL_TABLET | Freq: Every day | ORAL | 0 refills | Status: DC

## 2023-02-11 MED ORDER — CEPHALEXIN 500 MG PO CAPS: 500.0000 mg | ORAL_CAPSULE | Freq: Four times a day (QID) | ORAL | 0 refills | Status: DC

## 2023-02-11 NOTE — ED Triage Notes (Addendum)
Patient present with "swollen bump" in vaginal area x 4 weeks. Treated with ibuprofen 800 mg BID.

## 2023-02-11 NOTE — ED Provider Notes (Addendum)
[facility]    CSN: [id number] Arrival date & time: 02/11/23  1649      History   Chief Complaint No chief complaint on file.   HPI Kelli Harris is a 27 y.o. female who presents with swollen R groin node x 4 weeks. This started a couple of weeks after she had IUD placed and saw her GYN after it was removed and had a pelvic US which was normal. But they never palpated the groin lump she has. This area is tender but not getting any larger in the past month. She has had negative STD test in the past month and has not had sex with her boyfriend in 2 months. The tests were done after that sex encounter. Has not had fever, wt loss, sweats, fatigue or abnormal vaginal discharge.     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)    Eczema    Lupus (HCC)    Pituitary microadenoma (HCC)    Seasonal allergies     Patient Active Problem List   Diagnosis Date Noted   Pituitary lesion (HCC) 12/16/2017   Anxiety 12/16/2017   Depression 12/16/2017    Past Surgical History:  Procedure Laterality Date   ADENOIDECTOMY     Deviated septum repair     TONSILLECTOMY     WISDOM TOOTH EXTRACTION  2017    OB History     Gravida  0   Para  0   Term  0   Preterm  0   AB  0   Living  0      SAB  0   IAB  0   Ectopic  0   Multiple  0   Live Births  0            Home Medications    Prior to Admission medications   Medication Sig Start Date End Date Taking? Authorizing Provider  cephALEXin (KEFLEX) 500 MG capsule Take 1 capsule (500 mg total) by mouth 4 (four) times daily. 02/11/23  Yes Rodriguez-Southworth, Nettie Elm, PA-C  cetirizine (ZYRTEC) 10 MG tablet Take 10 mg by mouth daily.    [provider]  Cholecalciferol (VITAMIN D3) 400 units CAPS Take 1 capsule by mouth daily.    [provider]  EPINEPHrine (AUVI-Q) 0.3  mg/0.3 mL IJ SOAJ injection Inject into the upper outer thigh as needed for anaphylaxis Patient not taking: Reported on 12/16/2017 06/24/16   Governor Rooks, MD  Lysine HCl 500 MG TABS Take 1 tablet by mouth daily.    [provider]  methylphenidate 27 MG PO CR tablet Take 27 mg by mouth every morning.    [provider]  Multiple Vitamin (MULTIVITAMIN) capsule Take 1 capsule by mouth daily.    [provider]  naproxen (NAPROSYN) 500 MG tablet Take 1 tablet (500 mg total) by mouth 2 (two) times daily with a meal. 09/03/16   Lutricia Feil, PA-C  norgestimate-ethinyl estradiol (PREVIFEM) 0.25-35 MG-MCG tablet Take 1 tablet by mouth daily. 11/01/16   Farrel Conners, CNM  Omega-3 Fatty Acids (FISH OIL) 1000 MG CAPS Take by mouth.    [provider]  vitamin C (ASCORBIC ACID) 500 MG tablet Take by mouth.    [provider]    Family History Family History  Problem Relation Age of Onset   Osteoarthritis Father    Diabetes  Maternal Grandmother    Rickets Paternal Grandmother    Stroke Paternal Grandfather    Breast cancer Neg Hx    Ovarian cancer Neg Hx     Social History Social History   Tobacco Use   Smoking status: Never   Smokeless tobacco: Never  Substance Use Topics   Alcohol use: No   Drug use: No     Allergies   Other, Peanut-containing drug products, Gabapentin, and Peanut oil   Review of Systems Review of Systems  As noted in HPI Physical Exam Triage Vital Signs ED Triage Vitals  Encounter Vitals Group     BP 02/11/23 1704 95/64     Systolic BP Percentile --      Diastolic BP Percentile --      Pulse Rate 02/11/23 1704 98     Resp 02/11/23 1704 17     Temp 02/11/23 1704 98.2 F (36.8 C)     Temp Source 02/11/23 1704 Oral     SpO2 02/11/23 1704 100 %     Weight 02/11/23 1700 106 lb 6.4 oz (48.3 kg)     Height 02/11/23 1700 5\' 2"  (1.575 m)     Head Circumference --      Peak Flow --      Pain Score 02/11/23  1659 6     Pain Loc --      Pain Education --      Exclude from Growth Chart --    No data found.  Updated Vital Signs BP 95/64 (BP Location: Left Arm)   Pulse 98   Temp 98.2 F (36.8 C) (Oral)   Resp 17   Ht 5\' 2"  (1.575 m)   Wt 106 lb 6.4 oz (48.3 kg)   LMP 12/29/2022   SpO2 100%   BMI 19.46 kg/m   Visual Acuity Right Eye Distance:   Left Eye Distance:   Bilateral Distance:    Right Eye Near:   Left Eye Near:    Bilateral Near:     Physical Exam Vitals and nursing note reviewed.  Constitutional:      General: She is not in acute distress.    Appearance: She is not toxic-appearing.     Comments: slim  HENT:     Right Ear: External ear normal.     Left Ear: External ear normal.  Eyes:     General: No scleral icterus.    Conjunctiva/sclera: Conjunctivae normal.  Pulmonary:     Effort: Pulmonary effort is normal.  Musculoskeletal:        General: Normal range of motion.     Cervical back: Neck supple.  Lymphadenopathy:     Lower Body: Right inguinal adenopathy present. No left inguinal adenopathy.     Comments: Is about 1.5 cm x 1.5 cm soft and mobile but tender. No redness of the skin noted.   Skin:    General: Skin is warm and dry.  Neurological:     Mental Status: She is alert and oriented to person, place, and time.     Gait: Gait normal.  Psychiatric:        Mood and Affect: Mood normal.        Behavior: Behavior normal.        Thought Content: Thought content normal.        Judgment: Judgment normal.      UC Treatments / Results  Labs (all labs ordered are listed, but only abnormal results are displayed) Labs Reviewed - No  data to display  EKG   Radiology No results found.  Procedures Procedures (including critical care time)  Medications Ordered in UC Medications - No data to display  Initial Impression / Assessment and Plan / UC Course  I have reviewed the triage vital signs and the nursing notes.  R inguinal lymphadenitis  I  placed her on Keflex and Diflucan as noted Advised to call her PCP for FU in 2 weeks in case this nodes does not resolve.     Final Clinical Impressions(s) / UC Diagnoses   Final diagnoses:  Lymphadenitis   Discharge Instructions   None    ED Prescriptions     Medication Sig Dispense Auth. Provider   cephALEXin (KEFLEX) 500 MG capsule Take 1 capsule (500 mg total) by mouth 4 (four) times daily. 40 capsule Rodriguez-Southworth, Nettie Elm, PA-C      PDMP not reviewed this encounter.   Garey Ham, PA-C 02/11/23 1830    Rodriguez-Southworth, Nettie Elm, PA-C 02/11/23 1831    Garey Ham, PA-C 02/11/23 1911

## 2023-04-09 ENCOUNTER — Encounter: Payer: Self-pay | Admitting: Emergency Medicine

## 2023-04-09 ENCOUNTER — Ambulatory Visit
Admission: EM | Admit: 2023-04-09 | Discharge: 2023-04-09 | Disposition: A | Discharge status: Home / Self Care | Payer: Managed Care, Other (non HMO) | Attending: Family Medicine | Admitting: Family Medicine

## 2023-04-09 DIAGNOSIS — B9689 Other specified bacterial agents as the cause of diseases classified elsewhere: Secondary | ICD-10-CM | POA: Diagnosis present

## 2023-04-09 DIAGNOSIS — B3731 Acute candidiasis of vulva and vagina: Secondary | ICD-10-CM | POA: Insufficient documentation

## 2023-04-09 DIAGNOSIS — N760 Acute vaginitis: Secondary | ICD-10-CM | POA: Insufficient documentation

## 2023-04-09 LAB — WET PREP, GENITAL
Sperm: NONE SEEN
Trich, Wet Prep: NONE SEEN
WBC, Wet Prep HPF POC: >=10 — AB (ref <10)

## 2023-04-09 MED ORDER — METRONIDAZOLE 500 MG PO TABS
500.0000 mg | ORAL_TABLET | Freq: Two times a day (BID) | ORAL | 0 refills | Status: AC
Start: 2023-04-09 — End: 2023-04-16

## 2023-04-09 MED ORDER — FLUCONAZOLE 150 MG PO TABS: 150.0000 mg | ORAL_TABLET | ORAL | 0 refills | Status: AC

## 2023-04-09 NOTE — ED Provider Notes (Signed)
[facility]    CSN: [id number] Arrival date & time: 04/09/23  0930      History   Chief Complaint Chief Complaint  Patient presents with   Vaginal Discharge    HPI Kelli Harris is a 27 y.o. female.    Vaginal Discharge Here for vaginal itching, discharge, and odor.  No abdominal pain or vomiting or nausea.  No fever  She is not concerned about possibly having STIs.  She is allergic to gabapentin.  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)    Eczema    Lupus    Pituitary microadenoma (HCC)    Seasonal allergies     Patient Active Problem List   Diagnosis Date Noted   Pituitary lesion (HCC) 12/16/2017   Anxiety 12/16/2017   Depression 12/16/2017    Past Surgical History:  Procedure Laterality Date   ADENOIDECTOMY     Deviated septum repair     TONSILLECTOMY     WISDOM TOOTH EXTRACTION  2017    OB History     Gravida  0   Para  0   Term  0   Preterm  0   AB  0   Living  0      SAB  0   IAB  0   Ectopic  0   Multiple  0   Live Births  0            Home Medications    Prior to Admission medications   Medication Sig Start Date End Date Taking? Authorizing Provider  cetirizine (ZYRTEC) 10 MG tablet Take 10 mg by mouth daily.   Yes [provider]  Cholecalciferol (VITAMIN D3) 400 units CAPS Take 1 capsule by mouth daily.   Yes [provider]  fluconazole (DIFLUCAN) 150 MG tablet Take 1 tablet (150 mg total) by mouth every 3 (three) days for 3 doses. 04/09/23 04/16/23 Yes Amalie Koran, Janace Aris, MD  metroNIDAZOLE (FLAGYL) 500 MG tablet Take 1 tablet (500 mg total) by mouth 2 (two) times daily for 7 days. 04/09/23 04/16/23 Yes Zenia Resides, MD  Multiple Vitamin (MULTIVITAMIN) capsule Take 1 capsule by mouth daily.   Yes [provider]   vitamin C (ASCORBIC ACID) 500 MG tablet Take by mouth.   Yes [provider]  EPINEPHrine (AUVI-Q) 0.3 mg/0.3 mL IJ SOAJ injection Inject into the upper outer thigh as needed for anaphylaxis Patient not taking: Reported on 12/16/2017 06/24/16   Governor Rooks, MD    Family History Family History  Problem Relation Age of Onset   Osteoarthritis Father    Diabetes Maternal Grandmother    Rickets Paternal Grandmother    Stroke Paternal Grandfather    Breast cancer Neg Hx    Ovarian cancer Neg Hx     Social History Social History   Tobacco Use   Smoking status: Never   Smokeless tobacco: Never  Vaping Use   Vaping status: Never Used  Substance Use Topics   Alcohol use: No   Drug use: No     Allergies   Other, Peanut-containing drug products, Gabapentin, and Peanut oil   Review of Systems Review of Systems  Genitourinary:  Positive for vaginal discharge.     Physical Exam Triage Vital Signs ED Triage Vitals  Encounter Vitals Group     BP  04/09/23 0949 95/65     Systolic BP Percentile --      Diastolic BP Percentile --      Pulse Rate 04/09/23 0949 77     Resp 04/09/23 0949 14     Temp 04/09/23 0949 98.3 F (36.8 C)     Temp Source 04/09/23 0949 Oral     SpO2 04/09/23 0949 99 %     Weight 04/09/23 0946 112 lb (50.8 kg)     Height 04/09/23 0946 5\' 2"  (1.575 m)     Head Circumference --      Peak Flow --      Pain Score 04/09/23 0946 7     Pain Loc --      Pain Education --      Exclude from Growth Chart --    No data found.  Updated Vital Signs BP 95/65 (BP Location: Left Arm)   Pulse 77   Temp 98.3 F (36.8 C) (Oral)   Resp 14   Ht 5\' 2"  (1.575 m)   Wt 50.8 kg   LMP 03/20/2023 (Exact Date)   SpO2 99%   BMI 20.49 kg/m   Visual Acuity Right Eye Distance:   Left Eye Distance:   Bilateral Distance:    Right Eye Near:   Left Eye Near:    Bilateral Near:     Physical Exam Vitals reviewed.  Constitutional:      General: She is not in  acute distress.    Appearance: She is not ill-appearing, toxic-appearing or diaphoretic.  Skin:    Coloration: Skin is not pale.  Neurological:     Mental Status: She is alert and oriented to person, place, and time.  Psychiatric:        Behavior: Behavior normal.      UC Treatments / Results  Labs (all labs ordered are listed, but only abnormal results are displayed) Labs Reviewed  WET PREP, GENITAL - Abnormal; Notable for the following components:      Result Value   Yeast Wet Prep HPF POC PRESENT (*)    Clue Cells Wet Prep HPF POC PRESENT (*)    WBC, Wet Prep HPF POC >=10 (*)    All other components within normal limits    EKG   Radiology No results found.  Procedures Procedures (including critical care time)  Medications Ordered in UC Medications - No data to display  Initial Impression / Assessment and Plan / UC Course  I have reviewed the triage vital signs and the nursing notes.  Pertinent labs & imaging results that were available during my care of the patient were reviewed by me and considered in my medical decision making (see chart for details).     Wet prep shows yeast and clue cells.  Metronidazole is sent in to treat probable BV and Diflucan is sent in with 3 doses to treat candidiasis. Final Clinical Impressions(s) / UC Diagnoses   Final diagnoses:  Yeast vaginitis  BV (bacterial vaginosis)     Discharge Instructions      Wet prep test showed yeast and signs of BV.  Take fluconazole 150 mg--1 tablet every 3 days for 3 doses  Take metronidazole 500 mg--1 tablet 2 times daily for 7 days.  Avoid drinking alcohol within 72 hours of taking this medication      ED Prescriptions     Medication Sig Dispense Auth. Provider   metroNIDAZOLE (FLAGYL) 500 MG tablet Take 1 tablet (500 mg total) by mouth 2 (  two) times daily for 7 days. 14 tablet Skyler Carel, Janace Aris, MD   fluconazole (DIFLUCAN) 150 MG tablet Take 1 tablet (150 mg total) by mouth  every 3 (three) days for 3 doses. 3 tablet Trinty Marken, Janace Aris, MD      PDMP not reviewed this encounter.   Zenia Resides, MD 04/09/23 1016

## 2023-04-09 NOTE — Discharge Instructions (Signed)
Wet prep test showed yeast and signs of BV.  Take fluconazole 150 mg--1 tablet every 3 days for 3 doses  Take metronidazole 500 mg--1 tablet 2 times daily for 7 days.  Avoid drinking alcohol within 72 hours of taking this medication

## 2023-04-09 NOTE — ED Triage Notes (Signed)
Patient c/o vaginal discharge and vaginal itching that started 2 days ago.  Patient not concerned about STDs.

## 2023-07-31 ENCOUNTER — Encounter: Payer: Self-pay | Admitting: Emergency Medicine

## 2023-07-31 ENCOUNTER — Ambulatory Visit
Admission: EM | Admit: 2023-07-31 | Discharge: 2023-07-31 | Disposition: A | Discharge status: Home / Self Care | Payer: Managed Care, Other (non HMO) | Attending: Emergency Medicine | Admitting: Emergency Medicine

## 2023-07-31 DIAGNOSIS — Z3202 Encounter for pregnancy test, result negative: Secondary | ICD-10-CM | POA: Insufficient documentation

## 2023-07-31 DIAGNOSIS — H66002 Acute suppurative otitis media without spontaneous rupture of ear drum, left ear: Secondary | ICD-10-CM | POA: Insufficient documentation

## 2023-07-31 DIAGNOSIS — J019 Acute sinusitis, unspecified: Secondary | ICD-10-CM | POA: Diagnosis present

## 2023-07-31 LAB — PREGNANCY, URINE: Preg Test, Ur: NEGATIVE

## 2023-07-31 MED ORDER — PROMETHAZINE-DM 6.25-15 MG/5ML PO SYRP: 5.0000 mL | ORAL_SOLUTION | Freq: Four times a day (QID) | ORAL | 0 refills | Status: AC | PRN

## 2023-07-31 MED ORDER — FLUCONAZOLE 150 MG PO TABS: 150.0000 mg | ORAL_TABLET | Freq: Once | ORAL | 1 refills | Status: AC

## 2023-07-31 MED ORDER — AMOXICILLIN-POT CLAVULANATE 875-125 MG PO TABS: 1.0000 | ORAL_TABLET | Freq: Two times a day (BID) | ORAL | 0 refills | Status: AC

## 2023-07-31 MED ORDER — FLUTICASONE PROPIONATE 50 MCG/ACT NA SUSP: 2.0000 | Freq: Every day | NASAL | 0 refills | Status: AC

## 2023-07-31 NOTE — ED Triage Notes (Signed)
Pt c/o cough, nasal congestion, left ear pain and ringing. Started about a week ago. She states she had a fever in the beginning but none since.

## 2023-07-31 NOTE — ED Provider Notes (Signed)
HPI  SUBJECTIVE:  Kelli Harris is a 28 y.o. female who presents with 1 week of sinus pain and pressure, fevers Tmax 104 at the beginning of the illness.  She has been afebrile for the past 5 days.  She reports nasal congestion, rhinorrhea, which has also resolved.  She has 5 days of left ear pain, tinnitus, decreased hearing, cough productive of yellowish sputum, raw throat and postnasal drip.  No facial swelling, upper dental pain.  No vertigo, otorrhea.  Some wheezing, but this is resolved.  No shortness of breath.  She is unable to sleep at night because of the cough.  No antipyretic in the past 6 hours.  No antibiotics in the past 3 months.  She has been alternating ibuprofen and Tylenol, Mucinex and cetirizine without improvement in her symptoms.  Symptoms are worse with drinking cold liquids.  She has a past medical history of lupus with no renal involvement and a stable pituitary microadenoma.  LMP: 4 weeks ago.  Unsure if she could be pregnant.  She is irregular.  PCP: [facility].   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)    Eczema    Lupus    Pituitary microadenoma (HCC)    Seasonal allergies     Past Surgical History:  Procedure Laterality Date   ADENOIDECTOMY     Deviated septum repair     TONSILLECTOMY     WISDOM TOOTH EXTRACTION  2017    Family History  Problem Relation Age of Onset   Osteoarthritis Father    Diabetes Maternal Grandmother    Rickets Paternal Grandmother    Stroke Paternal Grandfather    Breast cancer Neg Hx    Ovarian cancer Neg Hx     Social History   Tobacco Use   Smoking status: Never   Smokeless tobacco: Never  Vaping Use   Vaping status: Never Used  Substance Use Topics   Alcohol use: No   Drug use: No    No current facility-administered medications for this encounter.  Current Outpatient Medications:     amoxicillin-clavulanate (AUGMENTIN) 875-125 MG tablet, Take 1 tablet by mouth every 12 (twelve) hours for 7 days., Disp: 14 tablet, Rfl: 0   cetirizine (ZYRTEC) 10 MG tablet, Take 10 mg by mouth daily., Disp: , Rfl:    fluticasone (FLONASE) 50 MCG/ACT nasal spray, Place 2 sprays into both nostrils daily., Disp: 16 g, Rfl: 0   Multiple Vitamin (MULTIVITAMIN) capsule, Take 1 capsule by mouth daily., Disp: , Rfl:    promethazine-dextromethorphan (PROMETHAZINE-DM) 6.25-15 MG/5ML syrup, Take 5 mLs by mouth 4 (four) times daily as needed for cough., Disp: 118 mL, Rfl: 0   vitamin C (ASCORBIC ACID) 500 MG tablet, Take by mouth., Disp: , Rfl:    Cholecalciferol (VITAMIN D3) 400 units CAPS, Take 1 capsule by mouth daily., Disp: , Rfl:    EPINEPHrine (AUVI-Q) 0.3 mg/0.3 mL IJ SOAJ injection, Inject into the upper outer thigh as needed for anaphylaxis (Patient not taking: Reported on 12/16/2017), Disp: 1 Device, Rfl: 3   ferrous sulfate 325 (65 FE) MG EC tablet, Take 325 mg by mouth daily with breakfast., Disp: , Rfl:    FEVERFEW PO, Take by mouth., Disp: , Rfl:   Allergies  Allergen Reactions   Other Anaphylaxis    ALL nuts -except for almond, pistachios, and walnuts   Peanut-Containing Drug Products  Anaphylaxis   Gabapentin     blindness   Peanut Oil Rash    Other reaction(s): ANAPHYLAXIS     ROS  As noted in HPI.   Physical Exam  BP 104/73 (BP Location: Left Arm)   Pulse 73   Temp 98.9 F (37.2 C) (Oral)   Resp 16   Ht 5\' 2"  (1.575 m)   Wt 49.4 kg   LMP 07/04/2023 (Approximate)   SpO2 96%   BMI 19.94 kg/m   Constitutional: Well developed, well nourished, no acute distress.  Occasional coughing. Eyes:  EOMI, conjunctiva normal bilaterally HENT: Normocephalic, atraumatic,mucus membranes moist.  Decreased hearing left ear.  Left ear: External ear, EAC normal.  TM erythematous, with purulent fluid behind the TM.  Light reflex sharp.  Tenderness at the tragus.  No TMJ tenderness,  crepitus.  Right TM normal.  Positive nasal congestion.  No maxillary, frontal sinus tenderness. Neck: Positive left-sided cervical lymphadenopathy Respiratory: Normal inspiratory effort, lungs clear bilaterally, good air movement. Cardiovascular: Normal rate GI: nondistended skin: No rash, skin intact Musculoskeletal: no deformities Neurologic: Alert & oriented x 3, no focal neuro deficits Psychiatric: Speech and behavior appropriate   ED Course   Medications - No data to display  Orders Placed This Encounter  Procedures   Pregnancy, urine    Standing Status:   Standing    Number of Occurrences:   1    Results for orders placed or performed during the hospital encounter of 07/31/23 (from the past 24 hours)  Pregnancy, urine     Status: None   Collection Time: 07/31/23 10:17 AM  Result Value Ref Range   Preg Test, Ur NEGATIVE NEGATIVE   No results found.  ED Clinical Impression  1. Non-recurrent acute suppurative otitis media of left ear without spontaneous rupture of tympanic membrane   2. Acute non-recurrent sinusitis, unspecified location   3. Urine pregnancy test negative      ED Assessment/Plan    Presentation consistent with a left otitis media status post URI.  I am also concerned that she may have a sinusitis as well.  Will send home with Augmentin, Flonase, Promethazine DM, saline nasal irrigation, discontinue cetirizine, continue Mucinex.  Diflucan as she states she gets frequent yeast infections with antibiotic use.  Checking urine pregnancy.  Pregnancy negative.  Plan as above.  Discussed labs, MDM, treatment plan, and plan for follow-up with patient.  patient agrees with plan.   Meds ordered this encounter  Medications   amoxicillin-clavulanate (AUGMENTIN) 875-125 MG tablet    Sig: Take 1 tablet by mouth every 12 (twelve) hours for 7 days.    Dispense:  14 tablet    Refill:  0   promethazine-dextromethorphan (PROMETHAZINE-DM) 6.25-15 MG/5ML syrup     Sig: Take 5 mLs by mouth 4 (four) times daily as needed for cough.    Dispense:  118 mL    Refill:  0   fluticasone (FLONASE) 50 MCG/ACT nasal spray    Sig: Place 2 sprays into both nostrils daily.    Dispense:  16 g    Refill:  0   fluconazole (DIFLUCAN) 150 MG tablet    Sig: Take 1 tablet (150 mg total) by mouth once for 1 dose. 1 tab po x 1. May repeat in 72 hours if no improvement    Dispense:  2 tablet    Refill:  1      *This clinic note was created using Scientist, clinical (histocompatibility and immunogenetics). Therefore, there may be occasional  mistakes despite careful proofreading.  ?    Domenick Gong, MD 08/01/23 [phone]

## 2023-07-31 NOTE — Discharge Instructions (Addendum)
Your urine pregnancy is negative.  Finish the Augmentin even if you feel better, Flonase, Promethazine DM for cough, saline nasal irrigation with a [employer] sinus rinse and distilled water as often as you want, discontinue cetirizine, continue Mucinex.  May continue taking Tylenol and ibuprofen, but I would recommend taking them together.
